# Patient Record
Sex: Female | Born: 1935 | Race: White | Hispanic: No | Marital: Married
Health system: Southern US, Community
[De-identification: ages and names within clinical notes are randomized; demographics above are authoritative.]

---

## 2010-12-05 ENCOUNTER — Ambulatory Visit: Payer: Self-pay | Admitting: Family Medicine

## 2012-02-04 ENCOUNTER — Ambulatory Visit: Payer: Self-pay | Admitting: Family Medicine

## 2012-03-21 ENCOUNTER — Encounter: Payer: Self-pay | Admitting: Cardiothoracic Surgery

## 2012-03-21 ENCOUNTER — Encounter: Payer: Self-pay | Admitting: Nurse Practitioner

## 2013-05-28 ENCOUNTER — Inpatient Hospital Stay: Payer: Self-pay | Admitting: Internal Medicine

## 2013-05-28 LAB — URINALYSIS, COMPLETE
Bilirubin,UR: NEGATIVE
Blood: NEGATIVE
Glucose,UR: NEGATIVE mg/dL (ref 0–75)
Leukocyte Esterase: NEGATIVE
Nitrite: NEGATIVE
Ph: 5 (ref 4.5–8.0)
Specific Gravity: 1.024 (ref 1.003–1.030)
Squamous Epithelial: NONE SEEN

## 2013-05-28 LAB — COMPREHENSIVE METABOLIC PANEL
Alkaline Phosphatase: 64 U/L (ref 50–136)
Anion Gap: 5 — ABNORMAL LOW (ref 7–16)
BUN: 17 mg/dL (ref 7–18)
Bilirubin,Total: 0.6 mg/dL (ref 0.2–1.0)
Chloride: 93 mmol/L — ABNORMAL LOW (ref 98–107)
Co2: 30 mmol/L (ref 21–32)
EGFR (Non-African Amer.): >60
Osmolality: 258 (ref 275–301)
SGOT(AST): 40 U/L — ABNORMAL HIGH (ref 15–37)
SGPT (ALT): 29 U/L (ref 12–78)
Sodium: 128 mmol/L — ABNORMAL LOW (ref 136–145)

## 2013-05-28 LAB — CBC
HCT: 37.2 % (ref 35.0–47.0)
HGB: 12.8 g/dL (ref 12.0–16.0)
MCHC: 34.3 g/dL (ref 32.0–36.0)
Platelet: 173 10*3/uL (ref 150–440)
RBC: 4.14 10*6/uL (ref 3.80–5.20)
RDW: 14.4 % (ref 11.5–14.5)
WBC: 16.3 10*3/uL — ABNORMAL HIGH (ref 3.6–11.0)

## 2013-05-28 LAB — TROPONIN I: Troponin-I: <0.02 ng/mL

## 2013-05-30 LAB — CBC WITH DIFFERENTIAL/PLATELET
Basophil %: 0.8 %
Eosinophil #: 0.1 10*3/uL (ref 0.0–0.7)
HCT: 32.9 % — ABNORMAL LOW (ref 35.0–47.0)
HGB: 11.4 g/dL — ABNORMAL LOW (ref 12.0–16.0)
Lymphocyte #: 1.5 10*3/uL (ref 1.0–3.6)
MCH: 31.1 pg (ref 26.0–34.0)
MCHC: 34.6 g/dL (ref 32.0–36.0)
MCV: 90 fL (ref 80–100)
Monocyte #: 0.9 x10 3/mm (ref 0.2–0.9)
Monocyte %: 10.5 %
Neutrophil #: 6.3 10*3/uL (ref 1.4–6.5)
Neutrophil %: 70.3 %
Platelet: 164 10*3/uL (ref 150–440)
RBC: 3.65 10*6/uL — ABNORMAL LOW (ref 3.80–5.20)
RDW: 13.6 % (ref 11.5–14.5)
WBC: 9 10*3/uL (ref 3.6–11.0)

## 2013-05-30 LAB — BASIC METABOLIC PANEL
Calcium, Total: 8.4 mg/dL — ABNORMAL LOW (ref 8.5–10.1)
Chloride: 99 mmol/L (ref 98–107)
Co2: 29 mmol/L (ref 21–32)
Creatinine: 0.61 mg/dL (ref 0.60–1.30)
EGFR (African American): >60
EGFR (Non-African Amer.): >60
Glucose: 98 mg/dL (ref 65–99)
Osmolality: 261 (ref 275–301)
Potassium: 4.7 mmol/L (ref 3.5–5.1)

## 2013-05-31 LAB — COMPREHENSIVE METABOLIC PANEL
Alkaline Phosphatase: 86 U/L (ref 50–136)
Bilirubin,Total: 0.5 mg/dL (ref 0.2–1.0)
Calcium, Total: 8.2 mg/dL — ABNORMAL LOW (ref 8.5–10.1)
Chloride: 99 mmol/L (ref 98–107)
Co2: 31 mmol/L (ref 21–32)
EGFR (African American): >60
EGFR (Non-African Amer.): >60
Glucose: 95 mg/dL (ref 65–99)
Osmolality: 266 (ref 275–301)
Potassium: 4.7 mmol/L (ref 3.5–5.1)
SGOT(AST): 37 U/L (ref 15–37)
SGPT (ALT): 49 U/L (ref 12–78)
Sodium: 134 mmol/L — ABNORMAL LOW (ref 136–145)
Total Protein: 5.5 g/dL — ABNORMAL LOW (ref 6.4–8.2)

## 2013-05-31 LAB — EXPECTORATED SPUTUM ASSESSMENT W GRAM STAIN, RFLX TO RESP C

## 2013-05-31 LAB — PROTIME-INR: Prothrombin Time: 20.2 secs — ABNORMAL HIGH (ref 11.5–14.7)

## 2013-06-01 LAB — CBC WITH DIFFERENTIAL/PLATELET
Basophil #: 0.1 10*3/uL (ref 0.0–0.1)
Basophil %: 0.7 %
Eosinophil #: 0.2 10*3/uL (ref 0.0–0.7)
HCT: 32.3 % — ABNORMAL LOW (ref 35.0–47.0)
Lymphocyte #: 1.7 10*3/uL (ref 1.0–3.6)
Monocyte %: 11.8 %
Neutrophil #: 6.3 10*3/uL (ref 1.4–6.5)
Neutrophil %: 67.2 %
Platelet: 223 10*3/uL (ref 150–440)
RBC: 3.61 10*6/uL — ABNORMAL LOW (ref 3.80–5.20)
RDW: 13.7 % (ref 11.5–14.5)

## 2013-06-01 LAB — PROTIME-INR: INR: 1.6

## 2013-06-02 LAB — PROTIME-INR
INR: 1.3
Prothrombin Time: 15.9 secs — ABNORMAL HIGH (ref 11.5–14.7)

## 2013-06-02 LAB — CULTURE, BLOOD (SINGLE)

## 2013-07-29 ENCOUNTER — Ambulatory Visit: Payer: Self-pay | Admitting: Gastroenterology

## 2014-01-30 ENCOUNTER — Emergency Department: Payer: Self-pay | Admitting: Internal Medicine

## 2014-01-30 LAB — COMPREHENSIVE METABOLIC PANEL
ANION GAP: 7 (ref 7–16)
AST: 17 U/L (ref 15–37)
Albumin: 3.4 g/dL (ref 3.4–5.0)
Alkaline Phosphatase: 43 U/L — ABNORMAL LOW
BUN: 13 mg/dL (ref 7–18)
Bilirubin,Total: 0.4 mg/dL (ref 0.2–1.0)
CHLORIDE: 96 mmol/L — AB (ref 98–107)
CO2: 31 mmol/L (ref 21–32)
CREATININE: 0.52 mg/dL — AB (ref 0.60–1.30)
Calcium, Total: 8.4 mg/dL — ABNORMAL LOW (ref 8.5–10.1)
EGFR (Non-African Amer.): >60
GLUCOSE: 96 mg/dL (ref 65–99)
Osmolality: 268 (ref 275–301)
Potassium: 4 mmol/L (ref 3.5–5.1)
SGPT (ALT): 13 U/L (ref 12–78)
Sodium: 134 mmol/L — ABNORMAL LOW (ref 136–145)
Total Protein: 6.7 g/dL (ref 6.4–8.2)

## 2014-01-30 LAB — CBC
HCT: 36.9 % (ref 35.0–47.0)
HGB: 11.9 g/dL — ABNORMAL LOW (ref 12.0–16.0)
MCH: 29.4 pg (ref 26.0–34.0)
MCHC: 32.3 g/dL (ref 32.0–36.0)
MCV: 91 fL (ref 80–100)
PLATELETS: 140 10*3/uL — AB (ref 150–440)
RBC: 4.05 10*6/uL (ref 3.80–5.20)
RDW: 14.5 % (ref 11.5–14.5)
WBC: 6.9 10*3/uL (ref 3.6–11.0)

## 2014-01-30 LAB — URINALYSIS, COMPLETE
BILIRUBIN, UR: NEGATIVE
Bacteria: NONE SEEN
Blood: NEGATIVE
Glucose,UR: NEGATIVE mg/dL (ref 0–75)
Leukocyte Esterase: NEGATIVE
NITRITE: NEGATIVE
PH: 5 (ref 4.5–8.0)
PROTEIN: NEGATIVE
Specific Gravity: 1.018 (ref 1.003–1.030)
WBC UR: 3 /HPF (ref 0–5)

## 2014-01-30 LAB — PROTIME-INR
INR: 2
Prothrombin Time: 22.6 secs — ABNORMAL HIGH (ref 11.5–14.7)

## 2014-02-20 DEATH — deceased

## 2014-11-12 NOTE — Discharge Summary (Signed)
PATIENT NAME:  Sydney Ruiz, Sydney Ruiz MR#:  [id number] DATE OF BIRTH:  04-Jul-1928  DATE OF ADMISSION:  05/28/2013 DATE OF DISCHARGE:  06/02/2013  DISPOSITION: Discharged to [facility] nursing home.   DISCHARGE DIAGNOSES: 1.  Multifocal pneumonia.  2.  Constipation.  3.  Right hip pain, chronic.  4.  Chronic atrial fibrillation.  5.  History of cerebrovascular accident with contractures, muscle spasms.  6.  Chronic pain syndrome.   CONSULTANTS: Dr. Toni Amendlapacs with psychiatry.   IMAGING STUDIES: Include CT scan of the chest, which showed multifocal pneumonia.   Abdominal x-ray showed constipation.   ADMITTING HISTORY AND PHYSICAL AND HOSPITAL COURSE: Please see detailed H and P dictated by Dr. Enedina FinnerSona Patel. In brief, an 81 year old female patient who a resident of an independent living facility and history of prior strokes presented to the hospital with fever, cough, congestion and right-sided pleuritic chest pain. Was found to have bilateral multifocal pneumonia, admitted to the hospitalist service.   HOSPITAL COURSE: The patient was started on IV antibiotics along with nebulizers p.r.n. and oxygen support. The patient improved well with normal white count by the day of discharge and no fever. The patient does not have any shortness of breath. Complains of some mild cough. The patient did have significant constipation secondary to her chronic narcotic use, was put on laxatives along with some lactulose and suppository Presently the plan is to discharge the patient to a nursing home once she has a bowel movement.   The patient's other chronic conditions have been stable during the hospital stay. She was seen by psychiatry. At family's request, her Zoloft dose has been increased.   Today on examination, her lungs sound clear. Cardiac examination shows S1 and S2 without any murmurs. No edema. Does have contractures from prior stroke.   DISCHARGE MEDICATIONS: Include:  1.  Baclofen 10 mg oral 3 times a day.   2.  PreserVision 1 tablet oral once a day.  3.  Sertraline 50 mg 2 tablets oral once a day.  4.  Coumadin 4 mg oral once a day.  5.  Vitamin D3 2000 international units oral once a day.  6.  Senna plus 2 tablets oral 2 times a day.  7.  Pravastatin 40 mg oral once a day.  8.  Nifedipine XL 30 mg oral once a day.  9.  Benadryl 50 mg oral once a day as needed.  10.  Ensure 240 mL oral 3 times a day.  11.  Levaquin 750 mg oral every 48 hours for 7 days.  12.  MiraLax once a day as needed for constipation.  13.  Oxycodone 5 mg oral 3 times a day as needed for pain.  14.  Zolpidem 10 mg 1/2 tablet oral once a day as needed.  15.  Flexeril 10 mg oral once a day as needed for spasms.   DISCHARGE INSTRUCTIONS: Low-sodium, low-fat diet, mechanical soft, thin liquids, no straws, aspiration precautions, and the patient needs aide with tray set up. Activity as tolerated using assistance. Follow up at the rehab with the physician in 1 to 2 days.   TIME SPENT: On day of discharge in discharge activity was 45 minutes. ____________________________ Molinda BailiffSrikar R. Nikole Swartzentruber, MD srs:sb D: 06/02/2013 14:15:04 ET T: 06/02/2013 14:29:29 ET JOB#: [id number]  cc: Wardell HeathSrikar R. Cienna Dumais, MD, <Dictator> Orie FishermanSRIKAR R Haddon Fyfe MD ELECTRONICALLY SIGNED 06/08/2013 20:45

## 2014-11-12 NOTE — Consult Note (Signed)
PATIENT NAME:  Sydney Ruiz, Isys MR#:  [id number] DATE OF BIRTH:  10/25/1927  DATE OF CONSULTATION:  05/29/2013  REFERRING PHYSICIAN:   CONSULTING PHYSICIAN:  Audery AmelJohn T. Clapacs, MD  IDENTIFYING INFORMATION AND REASON FOR CONSULTATION:  An 81 year old woman with a history of a longstanding impairment from a cerebral vascular accident.  She is currently in the hospital for fever, cough and a possible pneumonia.  Consultation for possible depression.   HISTORY OF PRESENT ILLNESS:  Information obtained from the family members primarily.  The patient has a very severe expressive aphasia that extends to not being able to communicate via writing either.  Her husband to whom she has been married for over 60 years as well as her adult daughter were present and were giving most of the history.  They report that for the past 2 to 3 weeks they have noticed some changes in her behavior.  She has been more irritable.  She responds more emotionally to minor inconveniences.  She seems to be more anxious.  General caretaking activities such as settling herself into a chair take much longer than they normally do because she seems very physically uncomfortable.  She seems more sensitive to small bumps and inconveniences.  They feel like her mood may be getting worse and are concerned about some depression.  Sleep habits have not changed.  The patient still goes to sleep late at night and wakes up mid to late morning most days.  Appetite has chronically been not so good, but has not been getting worse recently.  There has not been any instance of her refusing any medicine or care.  They have noticed a couple of occasions of crying spells although those seem to have been related to specific pain.  She has not been actually violent.  Activities that she normally enjoys such as watching specific television shows do not seem to have changed.  No medications have changed.  She has been taking Zoloft 100 mg per day in the morning for years,  probably ever since she had the stroke.  Nothing about that has changed.  It transpires as we talk further that the stress has increased in the house, not only on the patient, but on her husband.  Husband admits that he has been feeling more stressed and anxious recently.  Husband admits that he has been having some crying spells at times and feeling overwhelmed and worried.  It is acknowledged by everyone that his behavior has much effect on her emotionally as vice versa.   PAST PSYCHIATRIC HISTORY:  No history of depression or mental health problems predating the stroke which happened about 16 years ago.  She was treated with Zoloft afterwards.  No history of psychiatric hospitalizations.  No history of suicide attempts.  No history of psychotic diagnoses.   PAST MEDICAL HISTORY:  The patient had a left-sided stroke leaving her with a right hemiparalysis and a severe expressive aphasia.  She has not recovered significantly.  As a result she needs a great deal of help and daily care.  Needs care and help with transferring in and out of bed, with toileting, with eating, with almost all of her ADLs.  She has some chronic pain and there seems to be some suggestion that her pain has gotten worse especially on the right side recently.  Has hypertension, treated.  Has gastric reflux, treated.   SOCIAL HISTORY:  The patient and her husband live together at a facility that has a wide range of  care.  They live in the independent living section.  It had been considered in the past that they move to assisted living, but according to the daughter, she thinks they have missed the window currently, but they are still considering it.  Two adult daughters live in the area and are attentive.  No indication that I got that there was any difficulty within that part of the family.   SUBSTANCE ABUSE HISTORY:  None.   FAMILY HISTORY:  No family history of mental illness.   CURRENT MEDICATIONS:  Baclofen 10 mg 3 times a day,  vitamin D3 2,000 units a day, Senokot 1 tablet twice a day, multivitamin once a day, nifedipine 30 mg a day, oxycodone immediate strength 5 mg q. 8 hours, pravastatin 40 mg a day, sertraline 100 mg in the morning, warfarin 4 mg in the evening.   ALLERGIES:  AZITHROMYCIN AND ERYTHROMYCIN.   MENTAL STATUS EXAMINATION:  The patient is well-groomed considering her condition.  She was awake.  She was clearly attending to the whole conversation.  Eyes were open.  She followed the conversation.  She made some attempts to communicate with myself and with her family.  She has almost no use of language.  She can make some vocalizations that give the impression of affirmatives or negatives and she can shake her head yes or no, although the family say that they have found over the years, but this is not always reliable.  She did not appear to be aggressive or hostile.  She did not show any signs that she was aggressive or angry towards anyone.  Was not angry at any of the conversation.  At least one point in the conversation, she made very important contribution by managing to pantomime that her husband was having crying spells which he then admitted to me and turned out to be a fruitful piece of information.  There is no indication by anyone that she has made any kind of suicidal statements or expressions or been violent towards anyone.   LABORATORY RESULTS:  EKG shows a pacemaker, otherwise unremarkable.  Blood cultures so far have not grown anything.  Urinalysis looks clear and uninfected.  INR 3.2.  CBC shows an elevated white count at 16.  Chemistry panel shows a low sodium at 128.   ASSESSMENT:  This is an 81 year old woman with severe permanent neurologic impairment.  There has been concern raised by the family that her behavior has been changing.  The more detail I can get from the patient and the family, it really does not sound like a specific major depression.  There have been some emotional changes in her  behavior, but a lot of the things you might expect from an obvious depression are not present.  She is still enjoying her usual activities.  She is not having obvious frequent crying spells.  There is no refusal of treatment or negativity on her behalf.  It still possible that the irritability may represent some worsening anxiety and mood changes and it is probably worth trying to make some medication changes for it.  At the same time, it appears to me that perhaps the larger problem are frustrations within the system of the patient and her husband.  Husband clearly also has many medical problems and is feeling burdened by a lifetime of caring for his wife.  They have home health care regularly, but it does not come in every day and they have been very frustrated at the difficulty with  keeping home health care.  The husband and the daughter estimate that there have been somewhere around 50 to 100 different caregivers in the last few years because of difficulty keeping regular help.  Some of that might be that the help gets frustrated with the patient and her husband.  She seems to prefer that he do all of the care for her if possible, which he also prefers to do, but then gets upset when it is exhausting for him.  We spent some time discussing these issues which the husband and the daughter acknowledged.  I encouraged them to continue trying to work on getting more daily care.  I also encouraged them to look into perhaps moving into a more assisted section of their housing, although he already had excuses for that, some of which might be actually reasonable.  I encouraged at the recommendation of the daughter that the husband consider speaking with either his doctor or another provider about his own emotional needs.  Although we did not go into details of it there seemed to be some suggestion that the husband himself might be getting more depressed and could probably benefit from treatment, which I tried to  encourage.  I did increase the dose of the Zoloft by 50 mg a day by adding another 50 in the afternoon.  Additionally, I put in an order for hemorrhoid ointment.  On several occasions they mention that she had seemed to be having more discomfort in the rectal area recently and that they thought that that would be something that they would like to have addressed.  This seemed like an easy and obvious thing to do.  Less obvious would be possibly adjusting her pain medicine.  The family suggested they thought that maybe her pain was getting worse and was not as well controlled as it had been.  She is only taking 5 mg 3 times a day of oxycodone and probably could benefit from a little bit more given that it is not oversedating her now.  I will leave that however to her primary doctor and treatment team to decide.  I told him that I would follow up after the weekend if she is still in the hospital.  I spent time doing supportive and educational therapy.   DIAGNOSIS, PRINCIPAL AND PRIMARY:  AXIS I:  Depression, not otherwise specified.   SECONDARY DIAGNOSES: AXIS I:  Adjustment disorder, chronic, due to severe medical impairment.  AXIS II:  No diagnosis.  AXIS III:  Status post stroke, pacemaker, pneumonia, hypertension, metabolic abnormalities.  AXIS IV:  Severe from the obvious chronic impairment and burden of illness.  AXIS V:  Functioning at time of evaluation 35.   Total time spent on the consult 50 minutes.    ____________________________ Audery Amel, MD jtc:ea D: 05/29/2013 23:44:33 ET T: 05/30/2013 00:51:06 ET JOB#: [id number]  cc: Audery Amel, MD, <Dictator> Audery Amel MD ELECTRONICALLY SIGNED 05/31/2013 21:26

## 2014-11-12 NOTE — Consult Note (Signed)
Brief Consult Note: Diagnosis: depression nos prob adjustment due to chronic severe medical condition.   Patient was seen by consultant.   Consult note dictated.   Recommend further assessment or treatment.   Orders entered.   Comments: Psychiatry: Patient seen. Chart reviewed. Note dictated. PAtient with longstanding hx of stroke with recent increase in some irritability. Many non-specific symptoms and not at all clear there is a depression here. Has been more physically uncomfortable husband has been more stressed out as well. I did increase zoloft to 150mg  daily by adding pm dose and also added prn rectal ointment - a specific request. Family also thinks she may need more or different pain medicine but that I did not change. Supportive and educational counceling. Will follow if she is here post weekend.  Electronic Signatures: Audery Amellapacs, [place] Basham T (MD)  (Signed [phone]-Nov-14 20:37)  Authored: Brief Consult Note   Last Updated: 07-Nov-14 20:37 by Audery Amellapacs, Moustapha Tooker T (MD)

## 2014-11-12 NOTE — H&P (Signed)
PATIENT NAME:  Sydney Ruiz, Sydney Ruiz MR#:  [id number] DATE OF BIRTH:  [birth year]  DATE OF ADMISSION:  05/28/2013  PRIMARY CARE PHYSICIAN:  Dr. Tera Mater.  CHIEF COMPLAINT:  Fever, cough, congestion and right-sided pleuritic chest pain.   Ms. Telfair is a very pleasant 81 year old Caucasian female who has history of stroke in the remote past, who is bedbound, wheelchair bound, well taken care of by her husband at home. Comes to the Emergency Room from [facility] urgent care after she was seen there for fever, cough and congestion. The patient started having right-sided pleuritic chest pain. They sent the patient to the Emergency Room where she was found to have elevated white count of 16,000. Given her chest pain, CT of the chest was done, which was negative for PE; however, it showed multilobar pneumonia, more on the right than the left. The patient will get 1 dose of IV Levaquin. Blood cultures have been drawn in the Emergency Room. She is being admitted for further evaluation and management.   PAST MEDICAL HISTORY:  1.  CVA with the patient having new right-sided weakness and contractures. She is wheelchair bound and bedbound. She is taken care of at home by her husband.  2.   Chronic Afib, on Coumadin.  3.  Hyperlipidemia.   ALLERGIES: AZITHROMYCIN.   MEDICATIONS: 1.  Coumadin 4 mg p.o. at bedtime.  2.  Baclofen 10 mg t.i.d.  3.  Cyclobenzaprine 10 mg 1 tablet as needed for spasms.  4.  Nifedipine XL 30 mg p.o. at bedtime.  5.  Oxycodone 5 mg t.i.d.  6.  Pravastatin 40 mg at bedtime.  7.  PreserVision 1 tablet daily.  8.  Senna Plus 2 tablets b.i.d.  9.  Zoloft 50 mg 2 tablets once a day.  10.  Vitamin D3, 2000 International Units daily.  11.  Ambien 10 mg 1/2 tablet at bedtime as needed for sleep.   FAMILY HISTORY: Positive for hypertension.   SOCIAL HISTORY: The patient is a nonsmoker, nonalcoholic, lives at home with her husband, who cares for her.   REVIEW OF SYSTEMS:   CONSTITUTIONAL: Positive for low-grade fever at home, fatigue and weakness.  EYES: No blurred or double vision, glaucoma or cataracts.  EARS, NOSE, THROAT: No tinnitus, ear pain, hearing loss, epistaxis or discharge.  RESPIRATORY: Positive for cough, congestion and shortness of breath.  CARDIOVASCULAR:  Pleuritic chest pain. No arrhythmia. No palpitations.  GASTROINTESTINAL: No nausea, vomiting, diarrhea, abdominal pain, melena or ulcers.  GENITOURINARY: No dysuria, hematuria or frequency.  ENDOCRINE: No polyuria or nocturia or thyroid problems.  HEMATOLOGY: No anemia or easy bruising.  SKIN: No acne or rash.  MUSCULOSKELETAL: The patient has contractures in her right upper extremity and right lower extremity due to chronic side effects from stroke. No joint swelling or gout.  NEUROLOGIC: Positive for late effects of stroke noted. Mild dysarthria., No tremors or seizures.  PSYCHIATRIC:  No anxiety or depression. All other systems reviewed and negative.   PHYSICAL EXAM:  GENERAL:  The patient is awake, alert, oriented x 3, not in acute distress.  VITAL SIGNS: She is afebrile. Pulse is 80. Blood pressure is 138/67.  Sat is 100% on 2 liters.  HEENT: Atraumatic, normocephalic. Pupils: PERRLA. EOM intact. Oral mucosa is dry.  NECK: Supple. No JVD. No carotid bruit.  RESPIRATORY: There are decreased breath sounds at the bases. No rales or rhonchi heard. No use of accessory muscles. The patient currently is not in respiratory distress.  CARDIOVASCULAR: Both  heart sounds are normal. Rate, rhythm regular. PMI not lateralized. Chest nontender.  EXTREMITIES: Good pedal pulses, good femoral pulses. No lower extremity edema.  ABDOMEN: Soft, benign, nontender. No organomegaly. Positive bowel sounds.  NEUROLOGIC: The patient does have significant cord contractures in the right upper and lower extremities secondary to her late side effects from stroke. She has limited movements secondary to her stroke. The  patient is bedbound and wheelchair bound.  SKIN: Warm and dry.  PSYCHIATRIC:  The patient is awake, alert, oriented x 3.   CT of the chest shows no PE. Patchy  right lower lobe consolidation concerning for pneumonia associated with mild bronchial wall thickening. No pleural effusion. No nodules or masses. Multifocal pneumonia. No acute intra-abdominal pelvic process. Severe calcific atherosclerosis.   ASSESSMENT: An 81 year old Ms. Sydney Ruiz with history of cerebral vascular accident  in the past with late effects of side effects of stroke, who is bedbound, wheelchair bound, and history of chronic atrial fib on Coumadin, comes in with:  1.  Multifocal pneumonia. The patient came in with right-sided pleuritic chest pain along with a low-grade fever at home, chest congestion and cough. The patient will be admitted on the medical floor. We will give IV fluids. Follow blood cultures, sputum cultures. We will give IV Levaquin. Follow up white count. Breathing treatment as needed.  2.   Hyperlipidemia. Continue pravastatin.  3.  History of chronic atrial fib status post ventricular pacemaker on Coumadin. Continue Coumadin. The patient's INR is therapeutic. She takes nifedipine for rate control. We will continue with that as well.  4.  Muscle spasm secondary to severe contractures from previous stroke. P.r.n. cyclobenzaprine and around the clock Baclofen.   5.  Chronic pain. Oxycodone p.r.n.  6.  Deep vein thrombosis prophylaxis. The patient is already on Coumadin with therapeutic INR.  7.  Care management for discharge planning.   (Dictation Anomaly) discussed with the patient and the patient's husband who was present in the Emergency Room.   TIME SPENT: 55 minutes.      ____________________________ Wylie HailSona A. Allena KatzPatel, MD sap:dmm D: 05/28/2013 19:43:09 ET T: 05/28/2013 20:29:20 ET JOB#: [id number]  cc: Burlon Centrella A. Allena KatzPatel, MD, <Dictator> Rhona LeavensJames F. Burnett ShengHedrick, MD Willow OraSONA A Rogina Schiano MD ELECTRONICALLY SIGNED  05/29/2013 14:26

## 2016-01-07 IMAGING — CR RIGHT HIP - COMPLETE 2+ VIEW
1 series · 3 of 3 positions shown · non-contrast
Comparison: 05/31/2013

CLINICAL DATA: Right hip pain, fall

EXAM:
RIGHT HIP - COMPLETE 2+ VIEW

[Series 2: t hip ap right · 0.14mm/px · 3 of 3 slices shown]
[im 1/3]
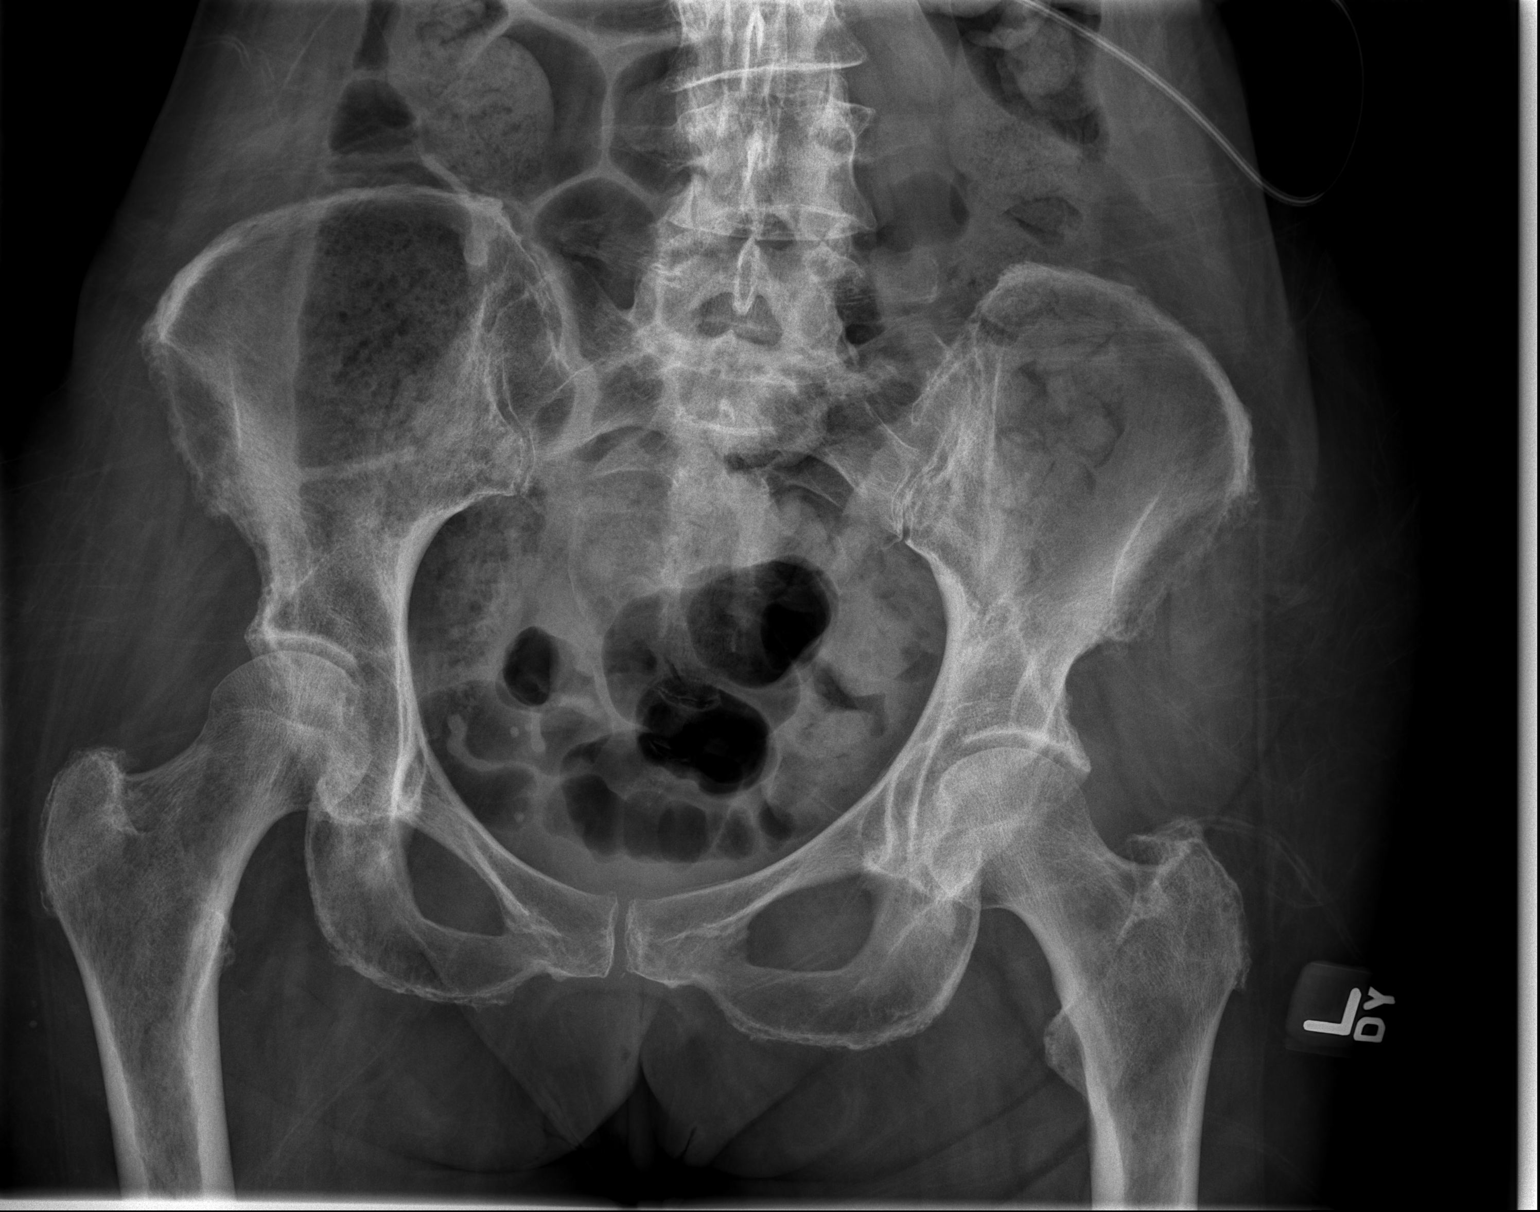
[im 2/3]
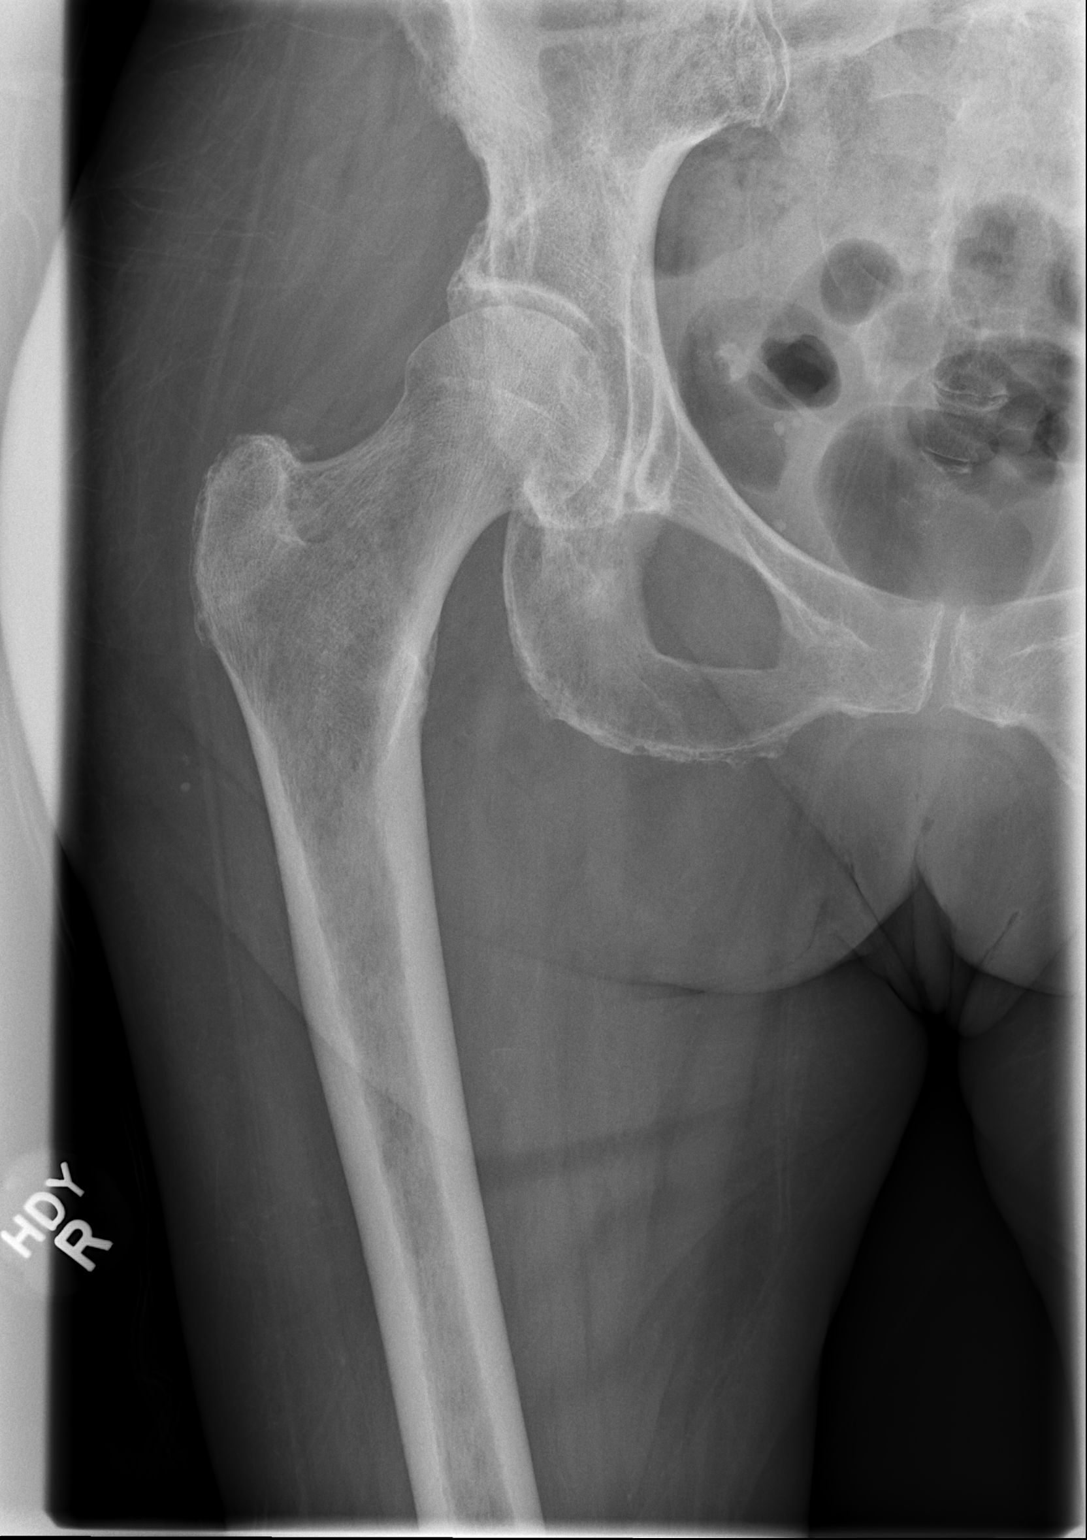
[im 3/3]
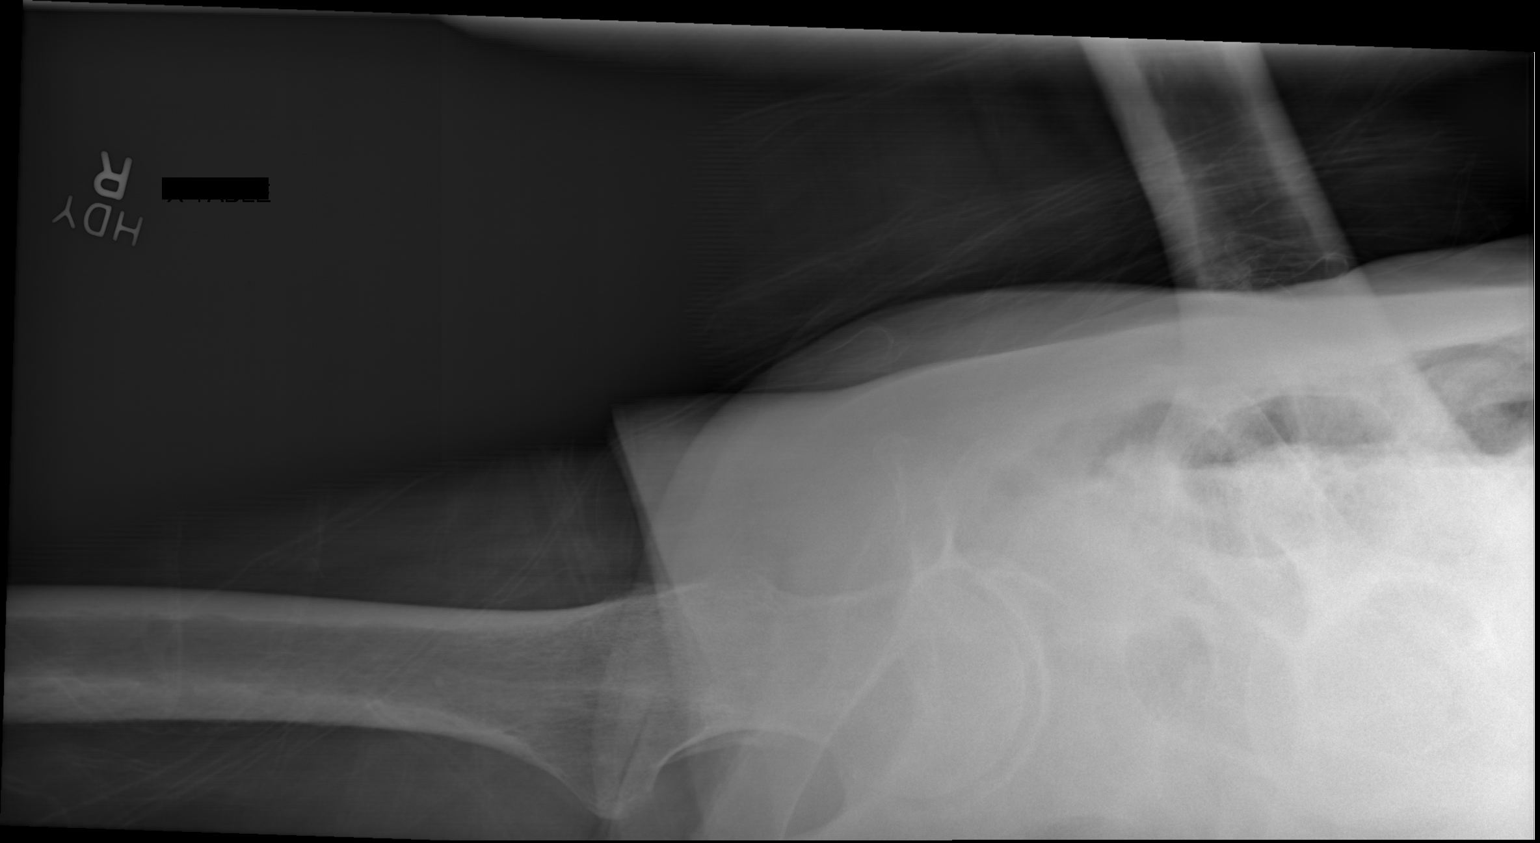

[3 of 3 positions shown; findings below may reference images not displayed]

FINDINGS: Sacroiliac joints are unremarkable. No displaced pelvic fracture is
identified. Mild leftward curvature of the lumbar spine centered at
L4 is present. Normal visualized bowel gas pattern allowing for
technique. The right hip appears properly located. No proximal
femoral fracture is identified.
IMPRESSION: No acute osseous abnormality.

## 2016-01-07 IMAGING — CT CT CERVICAL SPINE WITHOUT CONTRAST
4 of 5 series · 16 of 33 positions shown, 18 images · non-contrast
Comparison: None.

ADDENDUM:
CT CERVICAL SPINE: Soft tissues are unremarkable. Diffuse
degenerative changes are present. No evidence of fracture or
dislocation. Carotid vascular calcification present.
CLINICAL DATA: Fall.

EXAM:
CT HEAD WITHOUT CONTRAST
TECHNIQUE: Contiguous axial images were obtained from the base of the skull
through the vertex without intravenous contrast.

[Series 5: c spine soft · axial · 0.30mm/px · z∈[-251,-161]mm · 4 of 76 slices shown]
[im 16/76  soft-tissue]
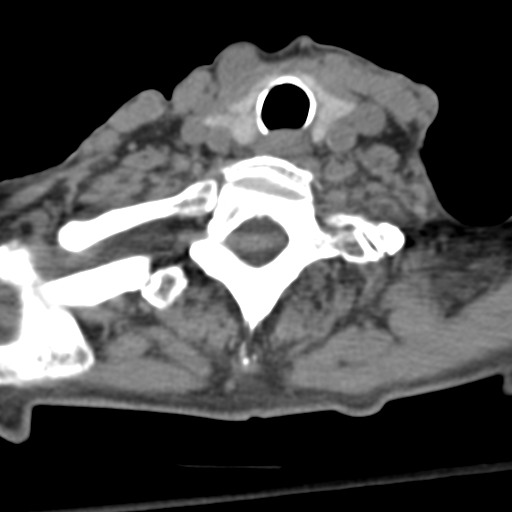
[im 31/76  soft-tissue]
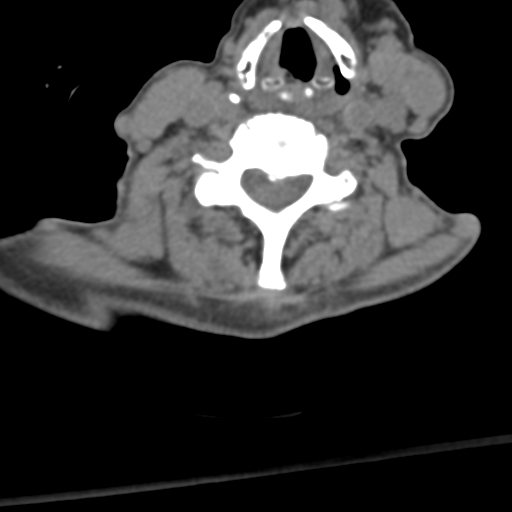
[im 46/76  soft-tissue]
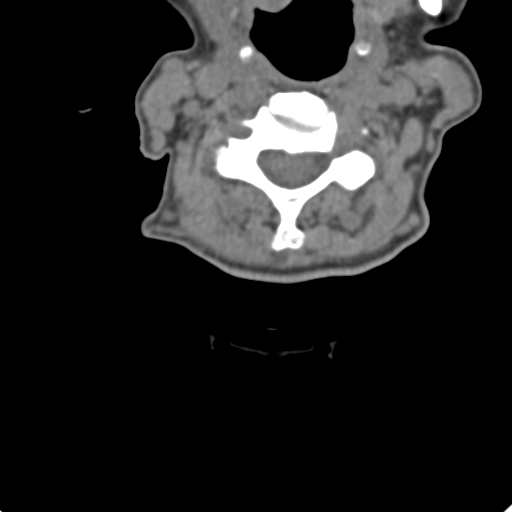
[im 61/76  soft-tissue]
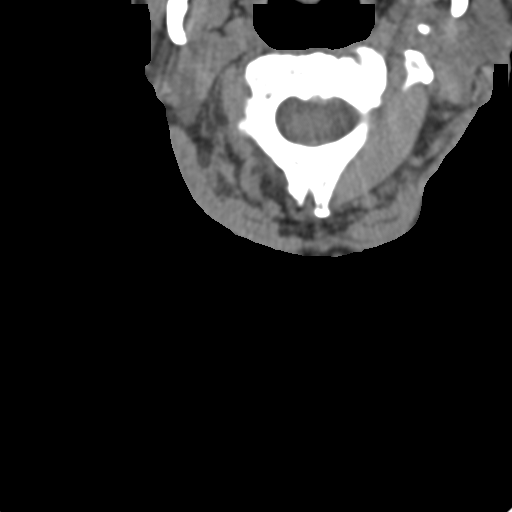

[Series 602: cor · coronal · 0.30mm/px · 3 of 33 slices shown]
[im 7/33  bone]
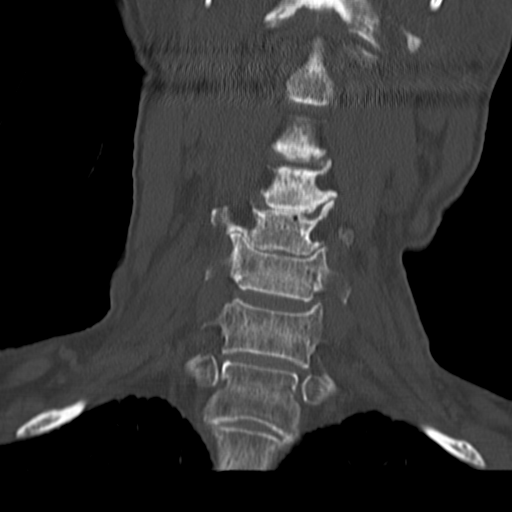
[im 13/33  bone]
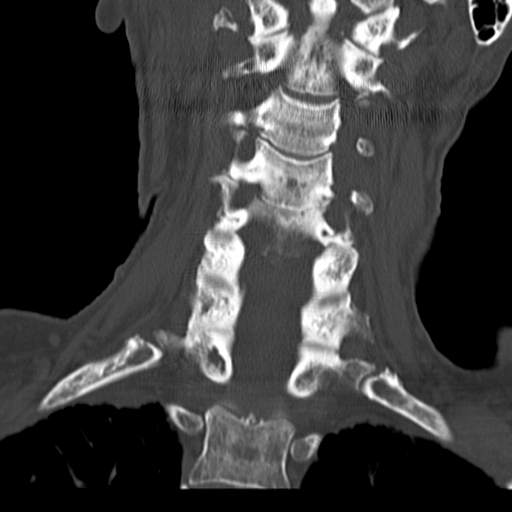
[im 20/33  bone]
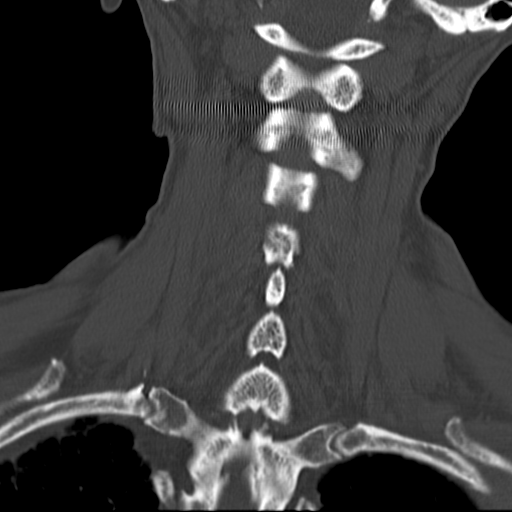

[Series 603: ortho · axial · 0.30mm/px · z∈[-275,-187]mm · 4 of 82 slices shown, 5 images]
[im 17/82  soft-tissue]
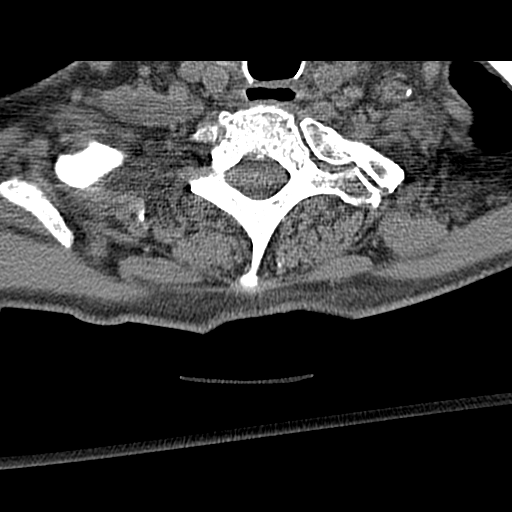
[im 17/82  bone]
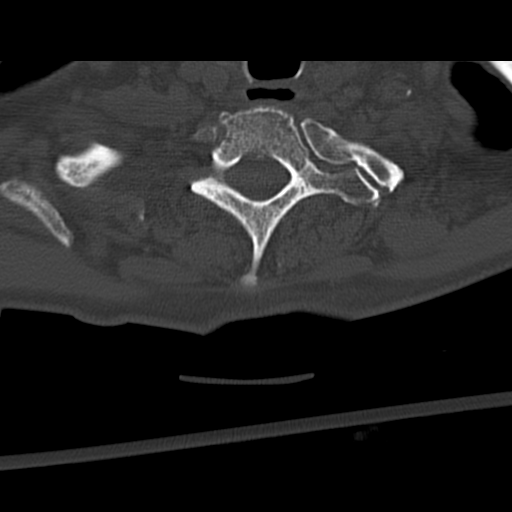
[im 33/82  bone]
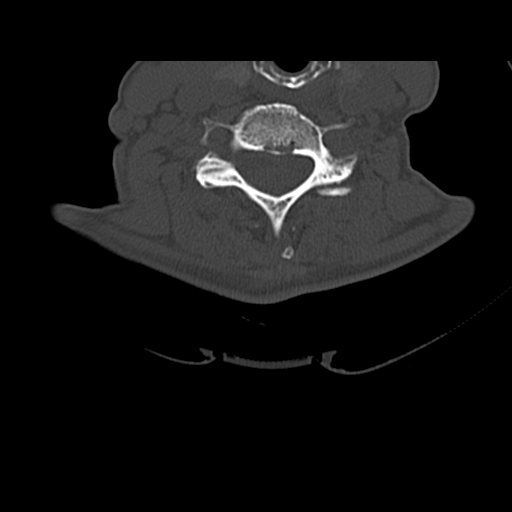
[im 49/82  bone]
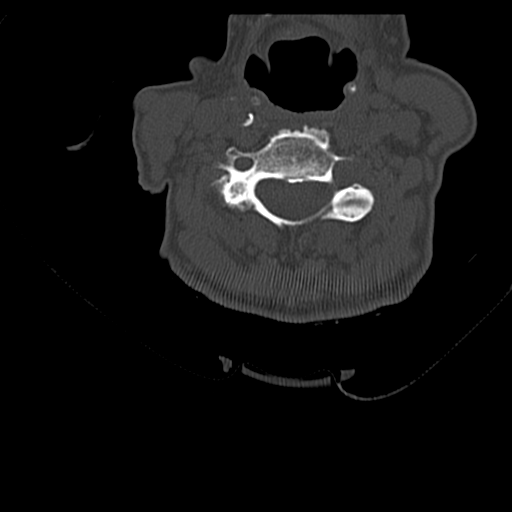
[im 65/82  bone]
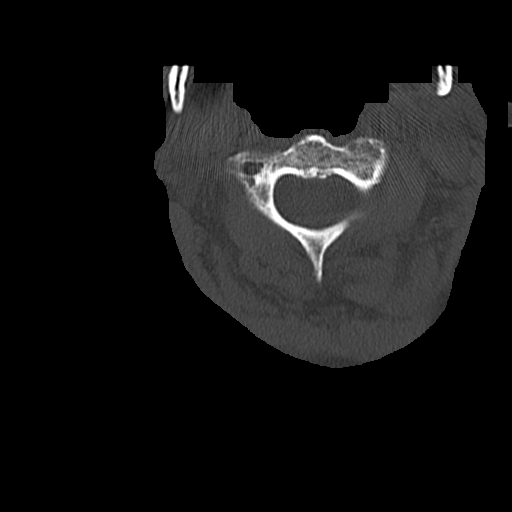

[Series 604: sag · sagittal · 0.30mm/px · 5 of 46 slices shown, 6 images]
[im 16/46  bone]
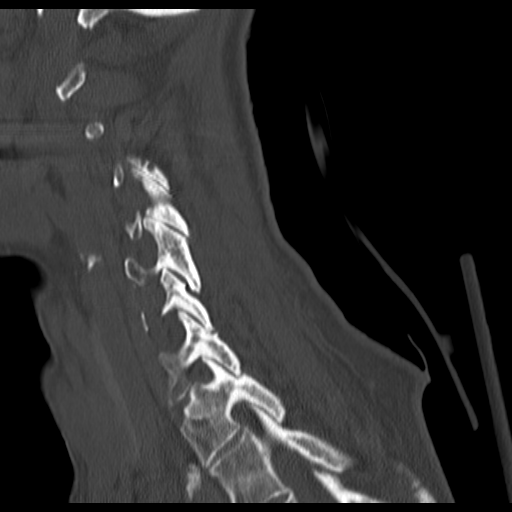
[im 19/46  bone]
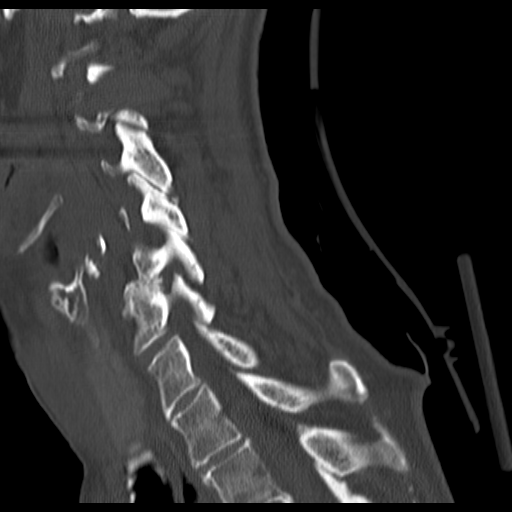
[im 23/46  soft-tissue]
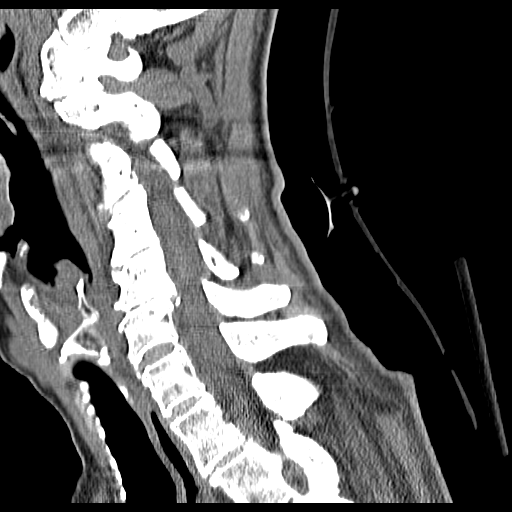
[im 23/46  bone]
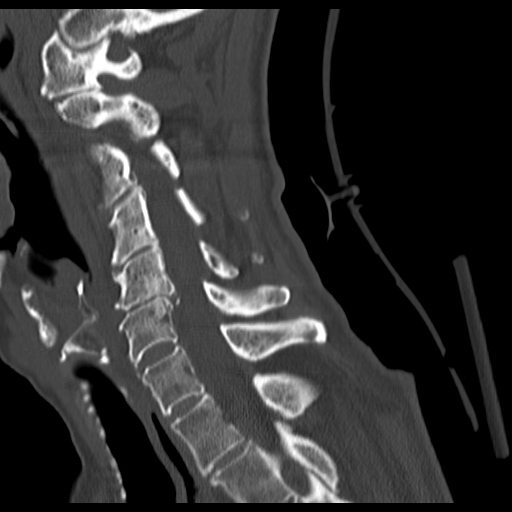
[im 27/46  bone]
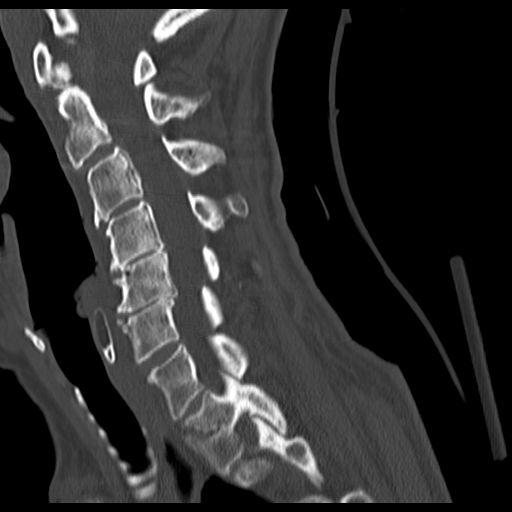
[im 31/46  bone]
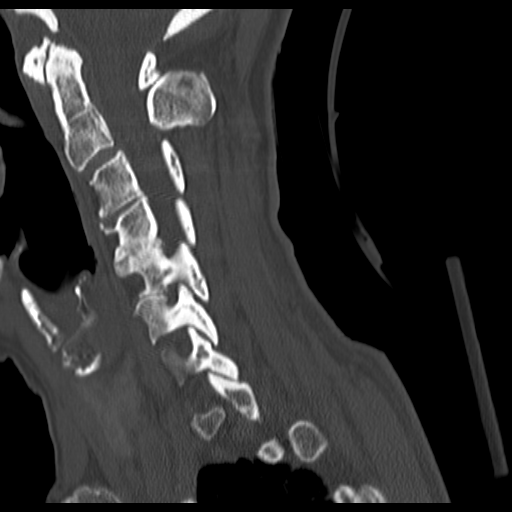

[16 of 33 positions shown; findings below may reference images not displayed]

CONCLUSION: Diffuse degenerative change.  No acute abnormality .
FINDINGS: Severe left temporal parietal encephalomalacia noted consistent with
prior infarction. Exam unchanged from prior exam. No mass. No
hydrocephalus. No hemorrhage. Periventricular white matter changes
consistent with chronic ischemia present. No acute bony abnormality
identified.
IMPRESSION: Left temporoparietal old infarct with associated encephalomalacia.
No acute abnormality.
